# Patient Record
Sex: Female | Born: 1960 | Race: Black or African American | Hispanic: No | Marital: Single
Health system: Southern US, Community
[De-identification: ages and names within clinical notes are randomized; demographics above are authoritative.]

---

## 2020-11-26 ENCOUNTER — Emergency Department: Payer: Self-pay

## 2020-11-26 ENCOUNTER — Emergency Department
Admission: EM | Admit: 2020-11-26 | Discharge: 2020-11-26 | Disposition: A | Discharge status: Home / Self Care | Payer: Self-pay | Attending: Emergency Medicine | Admitting: Emergency Medicine

## 2020-11-26 ENCOUNTER — Other Ambulatory Visit: Payer: Self-pay

## 2020-11-26 DIAGNOSIS — Y9301 Activity, walking, marching and hiking: Secondary | ICD-10-CM | POA: Insufficient documentation

## 2020-11-26 DIAGNOSIS — X501XXA Overexertion from prolonged static or awkward postures, initial encounter: Secondary | ICD-10-CM | POA: Insufficient documentation

## 2020-11-26 DIAGNOSIS — S92354A Nondisplaced fracture of fifth metatarsal bone, right foot, initial encounter for closed fracture: Secondary | ICD-10-CM | POA: Insufficient documentation

## 2020-11-26 MED ORDER — MELOXICAM 7.5 MG PO TABS
15.0000 mg | ORAL_TABLET | Freq: Once | ORAL | Status: AC
  Administered 2020-11-26: 15 mg via ORAL
  Filled 2020-11-26: qty 2

## 2020-11-26 MED ORDER — ACETAMINOPHEN 325 MG PO TABS
650.0000 mg | ORAL_TABLET | Freq: Once | ORAL | Status: AC
  Administered 2020-11-26: 650 mg via ORAL
  Filled 2020-11-26: qty 2

## 2020-11-26 MED ORDER — HYDROCODONE-ACETAMINOPHEN 5-325 MG PO TABS: 1.0000 | ORAL_TABLET | Freq: Four times a day (QID) | ORAL | 0 refills | Status: AC | PRN

## 2020-11-26 MED ORDER — HYDROCODONE-ACETAMINOPHEN 5-325 MG PO TABS
1.0000 | ORAL_TABLET | Freq: Once | ORAL | Status: AC
  Administered 2020-11-26: 1 via ORAL
  Filled 2020-11-26: qty 1

## 2020-11-26 NOTE — ED Provider Notes (Signed)
[facility] Emergency Department Provider Note  ____________________________________________   Event Date/Time   First MD Initiated Contact with Patient 11/26/20 1642     (approximate)  I have reviewed the triage vital signs and the nursing notes.   HISTORY  Chief Complaint Foot Injury  HPI Holly Patel is a 60 y.o. female who presents to emergency department for evaluation of right foot and ankle pain.  Patient states that she was out walking the neighborhood with her daughter-in-law when she did not realize there was a small lip/DIP between surfaces and twisted her right foot/ankle in a inversion mechanism.  She reports immediately feeling and hearing a pop.  She has had increased pain with weightbearing since that time.  Reports no attempt at alleviating measures prior to arrival.  Denies injuring anything else during the fall.         History reviewed. No pertinent past medical history.  There are no problems to display for this patient.   History reviewed. No pertinent surgical history.  Prior to Admission medications   Medication Sig Start Date End Date Taking? Authorizing Provider  HYDROcodone-acetaminophen (NORCO) 5-325 MG tablet Take 1 tablet by mouth every 6 (six) hours as needed for up to 5 days for moderate pain. 11/26/20 12/01/20 Yes Lucy Chris, PA    Allergies Patient has no allergy information on record.  No family history on file.  Social History    Review of Systems Constitutional: No fever/chills Eyes: No visual changes. ENT: No sore throat. Cardiovascular: Denies chest pain. Respiratory: Denies shortness of breath. Gastrointestinal: No abdominal pain.  No nausea, no vomiting.  No diarrhea.  No constipation. Genitourinary: Negative for dysuria. Musculoskeletal: + Right foot and ankle pain, negative for back pain. Skin: Negative for rash. Neurological: Negative for headaches, focal weakness or  numbness.   ____________________________________________   PHYSICAL EXAM:  VITAL SIGNS: ED Triage Vitals  Enc Vitals Group     BP 11/26/20 1625 (!) 177/71     Pulse Rate 11/26/20 1625 70     Resp 11/26/20 1625 19     Temp 11/26/20 1625 (!) 97.5 F (36.4 C)     Temp Source 11/26/20 1625 Oral     SpO2 11/26/20 1625 98 %     Weight --      Height --      Head Circumference --      Peak Flow --      Pain Score 11/26/20 1632 10     Pain Loc --      Pain Edu? --      Excl. in GC? --    Constitutional: Alert and oriented. Well appearing and in no acute distress. Eyes: Conjunctivae are normal. PERRL. EOMI. Head: Atraumatic. Neck: No stridor.   Cardiovascular: Normal rate, regular rhythm. Grossly normal heart sounds.  Good peripheral circulation. Respiratory: Normal respiratory effort.  No retractions. Lungs CTAB. Musculoskeletal: There is soft tissue swelling noted over the dorsum of the right foot, as well as some to the lateral ankle.  Range of motion significantly limited secondary to pain.  Dorsal pedal pulse 2+, posterior tibial pulse 2+.  There is significant tenderness to palpation of the lateral structures of the foot and ankle, most prominent at the fifth metatarsal. Neurologic:  Normal speech and language. No gross focal neurologic deficits are appreciated.  Gait not assessed secondary to right lower extremity injury Skin:  Skin is warm, dry and intact. No rash noted. Psychiatric: Mood and affect are  normal. Speech and behavior are normal.  ____________________________________________  RADIOLOGY I, Lucy Chris, personally viewed and evaluated these images (plain radiographs) as part of my medical decision making, as well as reviewing the written report by the radiologist.  ED provider interpretation: X-ray of the right foot demonstrates acute fracture of the fifth metatarsal, no other fractures identified of the foot or ankle.  Official radiology report(s): DG  Ankle Complete Right  Result Date: 11/26/2020 CLINICAL DATA:  Right lateral ankle pain, twisting injury. EXAM: RIGHT ANKLE - COMPLETE 3+ VIEW COMPARISON:  None. FINDINGS: There is no evidence of fracture, dislocation, or joint effusion. There is no evidence of arthropathy or other focal bone abnormality. Soft tissues are unremarkable. IMPRESSION: Negative. Electronically Signed   By: Charlett Nose M.D.   On: 11/26/2020 18:41   DG Foot Complete Right  Result Date: 11/26/2020 CLINICAL DATA:  Stepped wrong twisting RIGHT foot, pain at lateral aspect of foot, unable to bear weight EXAM: RIGHT FOOT COMPLETE - 3+ VIEW COMPARISON:  None FINDINGS: Mild osseous demineralization. Joint spaces preserved. Oblique nondisplaced fracture at distal fifth metatarsal diaphysis. No additional fracture, dislocation, or bone destruction. IMPRESSION: Nondisplaced oblique fracture at the distal RIGHT fifth metatarsal diaphysis. Electronically Signed   By: Ulyses Southward M.D.   On: 11/26/2020 17:21    ____________________________________________   PROCEDURES  Procedure(s) performed (including Critical Care):  .Ortho Injury Treatment  Date/Time: 11/26/2020 10:47 PM Performed by: Lucy Chris, PA Authorized by: Lucy Chris, PA   Consent:    Consent obtained:  Verbal   Consent given by:  Patient   Risks discussed:  Stiffness and restricted joint movement   Alternatives discussed:  No treatment and referralInjury location: foot Location details: right foot Injury type: fracture Fracture type: fifth metatarsal Pre-procedure neurovascular assessment: neurovascularly intact Pre-procedure distal perfusion: normal Pre-procedure neurological function: normal Pre-procedure range of motion: reduced  Anesthesia: Local anesthesia used: no  Patient sedated: NoManipulation performed: no Immobilization: crutches (CAM boot) Splint Applied by: ED Tech Post-procedure neurovascular assessment: post-procedure  neurovascularly intact Post-procedure distal perfusion: normal Post-procedure neurological function: normal Post-procedure range of motion: unchanged      ____________________________________________   INITIAL IMPRESSION / ASSESSMENT AND PLAN / ED COURSE  As part of my medical decision making, I reviewed the following data within the electronic MEDICAL RECORD NUMBER Nursing notes reviewed and incorporated, Radiograph reviewed and Notes from prior ED visits, [place] controlled substance database reviewed        Patient is a 60 year old female who presents to the emergency department for acute right foot and ankle pain, see HPI for further details.  In triage, patient is hypertensive, likely secondary to pain, other vitals within normal limits.  On physical exam, patient is able to actively dorsiflex and plantarflex the foot, but range of motion is significantly limited secondary to pain.  She is neurovascularly intact.  X-rays were obtained and demonstrate an acute nondisplaced fracture of the fifth metatarsal.  We will place the patient in a cam boot, and give crutches for nonweightbearing at this time until follow-up with orthopedics.  Encouraged Tylenol and anti-inflammatory use and prescribed the patient a short course of narcotic pain medication.  Findings were discussed with the patient, she is amenable with plan.  Patient stable this time for outpatient follow-up, return precautions were discussed.      ____________________________________________   FINAL CLINICAL IMPRESSION(S) / ED DIAGNOSES  Final diagnoses:  Closed nondisplaced fracture of fifth metatarsal bone of right foot, initial encounter  ED Discharge Orders         Ordered    HYDROcodone-acetaminophen (NORCO) 5-325 MG tablet  Every 6 hours PRN        11/26/20 1857          *Please note:  Holly Patel was evaluated in Emergency Department on 11/26/2020 for the symptoms described in the history of present illness.  She was evaluated in the context of the global COVID-19 pandemic, which necessitated consideration that the patient might be at risk for infection with the SARS-CoV-2 virus that causes COVID-19. Institutional protocols and algorithms that pertain to the evaluation of patients at risk for COVID-19 are in a state of rapid change based on information released by regulatory bodies including the CDC and federal and state organizations. These policies and algorithms were followed during the patient's care in the ED.  Some ED evaluations and interventions may be delayed as a result of limited staffing during and the pandemic.*   Note:  This document was prepared using Dragon voice recognition software and may include unintentional dictation errors.   Lucy Chris, PA 11/26/20 2253    [place]ton [place], MD 11/27/20 [phone]

## 2020-11-26 NOTE — ED Notes (Signed)
See triage note  Presents with pain to right foot  States she stepped wrong and twisted her foot  Swelling noted to lateral aspect of foot  Unable to bear wt    Good pulses

## 2020-11-26 NOTE — Discharge Instructions (Signed)
Follow-up with orthopedics when you return home.  Wear cam boot and use crutches until follow-up.  Use Tylenol and anti-inflammatory intermittently for pain.  You have also been prescribed a short course of narcotic pain medication to use for breakthrough pain.  Return to ER with any worsening.

## 2020-11-26 NOTE — ED Triage Notes (Signed)
Pt comes with c/o right foot pain. Pt states she injured it today. Pt states swelling and pain.

## 2021-08-16 IMAGING — DX DG ANKLE COMPLETE 3+V*R*
3 series · 3 of 3 positions shown · non-contrast
Comparison: None.

CLINICAL DATA: Right lateral ankle pain, twisting injury.

EXAM:
RIGHT ANKLE - COMPLETE 3+ VIEW

[ankle ap]
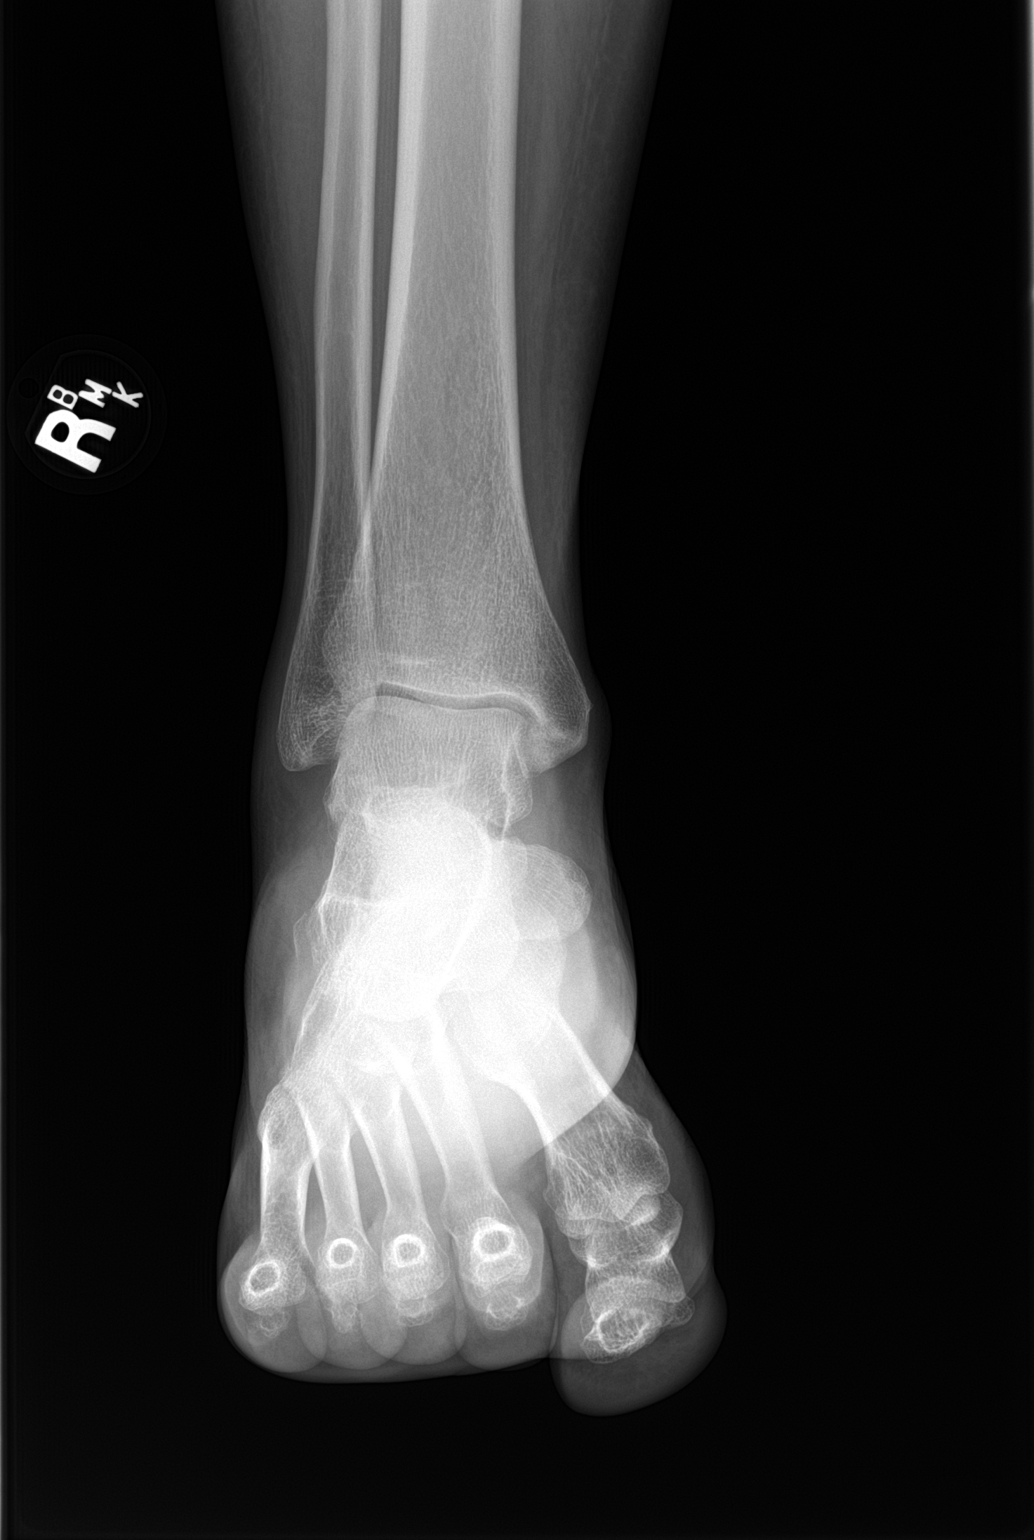

[ankle obl]
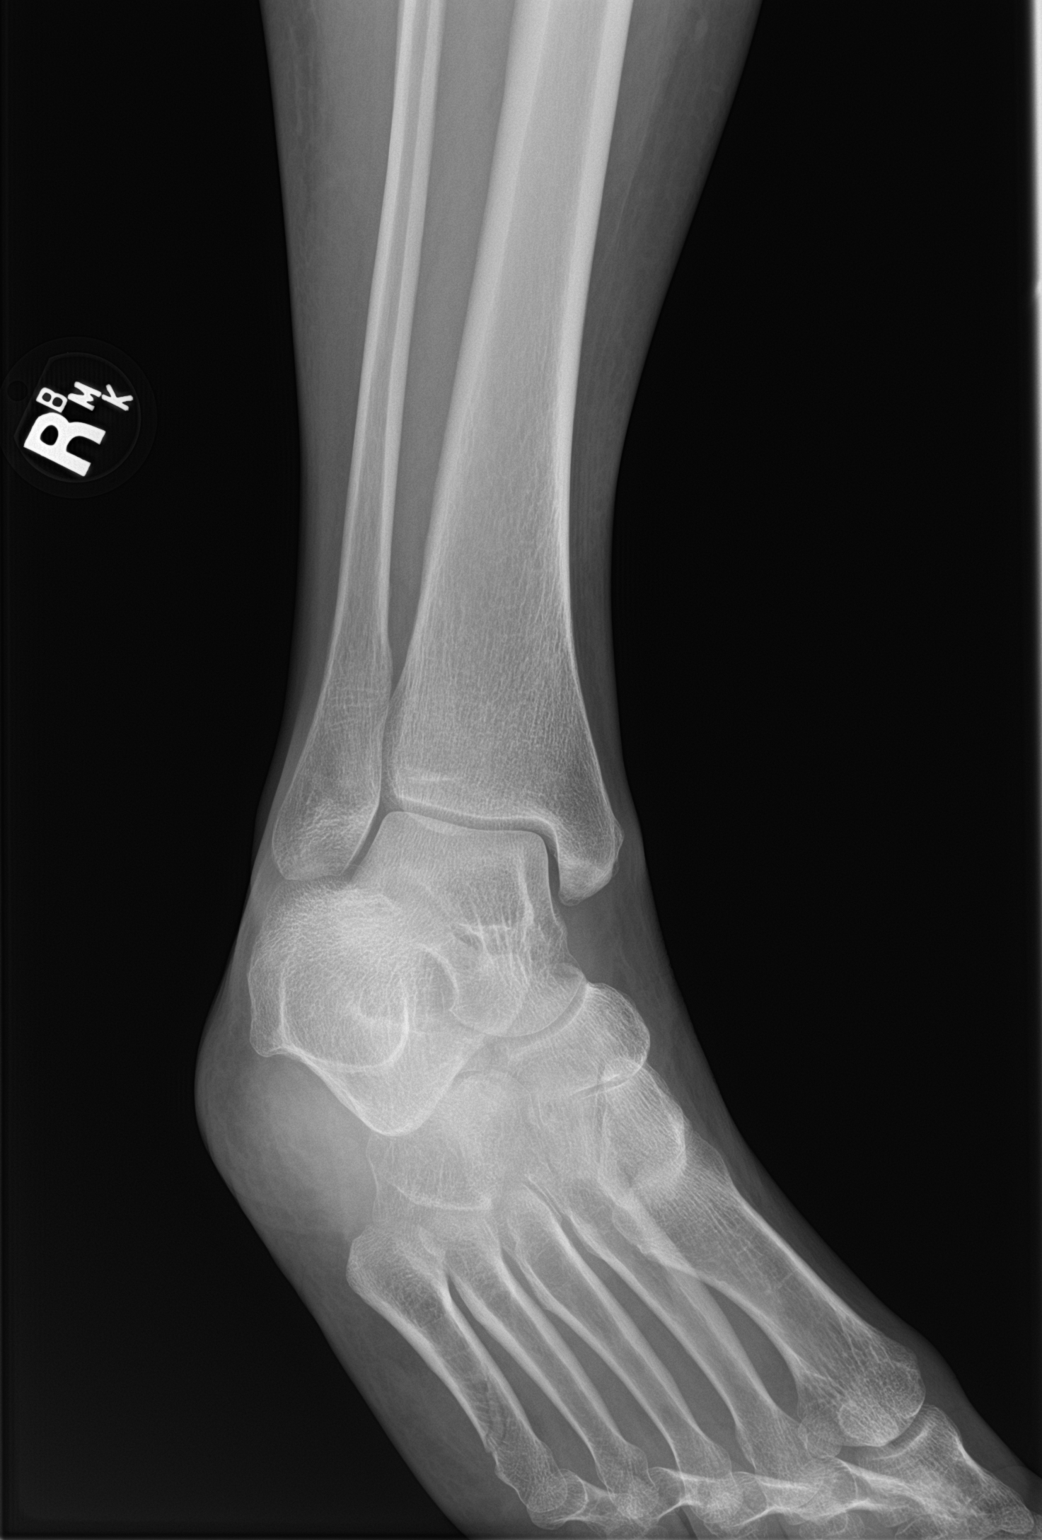

[ankle lat]
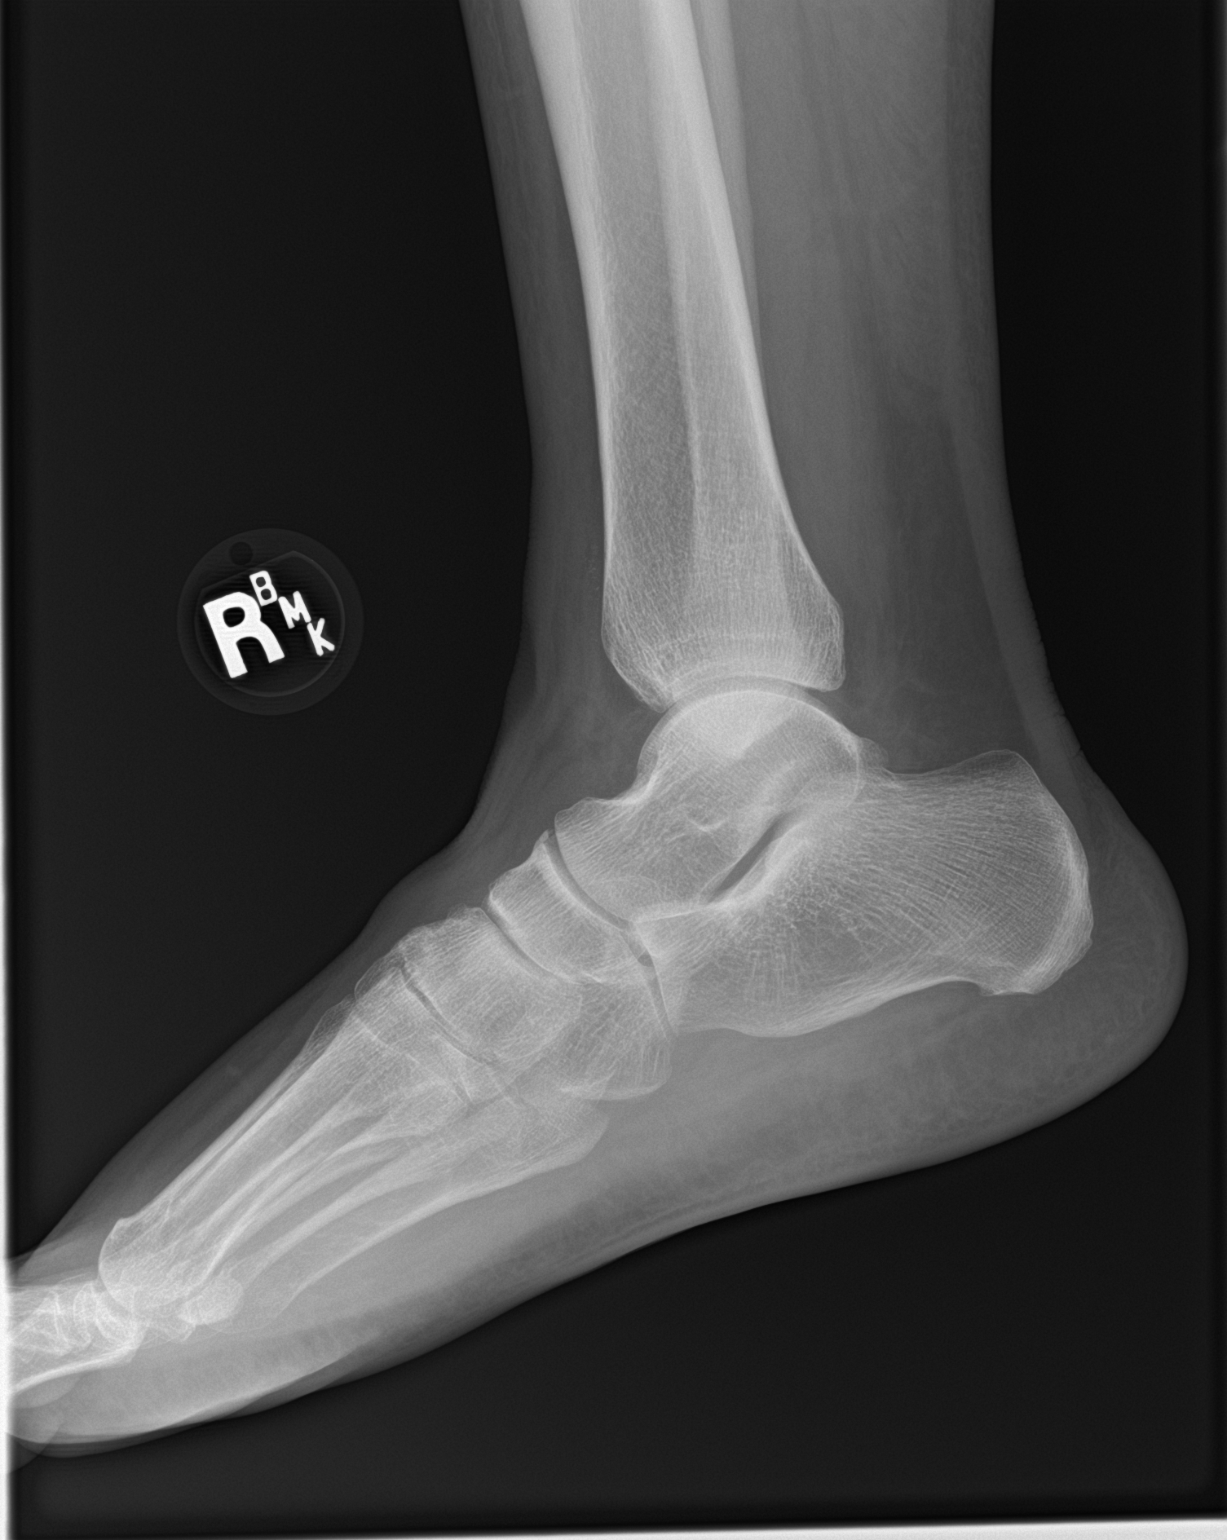

[3 of 3 positions shown; findings below may reference images not displayed]

FINDINGS: There is no evidence of fracture, dislocation, or joint effusion.
There is no evidence of arthropathy or other focal bone abnormality.
Soft tissues are unremarkable.
IMPRESSION: Negative.
